# Patient Record
Sex: Male | Born: 1970
Health system: Southern US, Community
[De-identification: ages and names within clinical notes are randomized; demographics above are authoritative.]

## PROBLEM LIST (undated history)

## (undated) DIAGNOSIS — R7303 Prediabetes: Secondary | ICD-10-CM

## (undated) DIAGNOSIS — I10 Essential (primary) hypertension: Secondary | ICD-10-CM

## (undated) DIAGNOSIS — E785 Hyperlipidemia, unspecified: Secondary | ICD-10-CM

---

## 2018-06-03 DIAGNOSIS — Z0000 Encounter for general adult medical examination without abnormal findings: Secondary | ICD-10-CM | POA: Diagnosis not present

## 2018-06-03 DIAGNOSIS — I10 Essential (primary) hypertension: Secondary | ICD-10-CM | POA: Diagnosis not present

## 2018-06-03 DIAGNOSIS — Z8379 Family history of other diseases of the digestive system: Secondary | ICD-10-CM | POA: Diagnosis not present

## 2018-06-03 DIAGNOSIS — R69 Illness, unspecified: Secondary | ICD-10-CM | POA: Diagnosis not present

## 2018-06-03 DIAGNOSIS — E7800 Pure hypercholesterolemia, unspecified: Secondary | ICD-10-CM | POA: Diagnosis not present

## 2018-06-03 DIAGNOSIS — R7303 Prediabetes: Secondary | ICD-10-CM | POA: Diagnosis not present

## 2018-06-03 DIAGNOSIS — Z125 Encounter for screening for malignant neoplasm of prostate: Secondary | ICD-10-CM | POA: Diagnosis not present

## 2018-06-03 DIAGNOSIS — R194 Change in bowel habit: Secondary | ICD-10-CM | POA: Diagnosis not present

## 2018-07-21 DIAGNOSIS — Z713 Dietary counseling and surveillance: Secondary | ICD-10-CM | POA: Diagnosis not present

## 2018-07-21 DIAGNOSIS — Z6833 Body mass index (BMI) 33.0-33.9, adult: Secondary | ICD-10-CM | POA: Diagnosis not present

## 2018-07-28 DIAGNOSIS — Z79899 Other long term (current) drug therapy: Secondary | ICD-10-CM | POA: Diagnosis not present

## 2018-08-18 DIAGNOSIS — Z713 Dietary counseling and surveillance: Secondary | ICD-10-CM | POA: Diagnosis not present

## 2018-08-18 DIAGNOSIS — Z6833 Body mass index (BMI) 33.0-33.9, adult: Secondary | ICD-10-CM | POA: Diagnosis not present

## 2018-10-19 DIAGNOSIS — H524 Presbyopia: Secondary | ICD-10-CM | POA: Diagnosis not present

## 2018-10-19 DIAGNOSIS — H5213 Myopia, bilateral: Secondary | ICD-10-CM | POA: Diagnosis not present

## 2019-02-06 ENCOUNTER — Other Ambulatory Visit: Payer: Self-pay

## 2019-02-06 ENCOUNTER — Emergency Department (HOSPITAL_COMMUNITY)
Admission: EM | Admit: 2019-02-06 | Discharge: 2019-02-06 | Disposition: A | Discharge status: Home / Self Care | Payer: 59 | Attending: Emergency Medicine | Admitting: Emergency Medicine

## 2019-02-06 ENCOUNTER — Encounter (HOSPITAL_COMMUNITY): Payer: Self-pay | Admitting: Emergency Medicine

## 2019-02-06 ENCOUNTER — Emergency Department (HOSPITAL_COMMUNITY): Payer: 59

## 2019-02-06 DIAGNOSIS — Y999 Unspecified external cause status: Secondary | ICD-10-CM | POA: Insufficient documentation

## 2019-02-06 DIAGNOSIS — Y929 Unspecified place or not applicable: Secondary | ICD-10-CM | POA: Diagnosis not present

## 2019-02-06 DIAGNOSIS — Z23 Encounter for immunization: Secondary | ICD-10-CM | POA: Insufficient documentation

## 2019-02-06 DIAGNOSIS — S99921A Unspecified injury of right foot, initial encounter: Secondary | ICD-10-CM

## 2019-02-06 DIAGNOSIS — Y9389 Activity, other specified: Secondary | ICD-10-CM | POA: Diagnosis not present

## 2019-02-06 DIAGNOSIS — S91342A Puncture wound with foreign body, left foot, initial encounter: Secondary | ICD-10-CM | POA: Insufficient documentation

## 2019-02-06 DIAGNOSIS — W11XXXA Fall on and from ladder, initial encounter: Secondary | ICD-10-CM | POA: Insufficient documentation

## 2019-02-06 DIAGNOSIS — I10 Essential (primary) hypertension: Secondary | ICD-10-CM | POA: Insufficient documentation

## 2019-02-06 DIAGNOSIS — T148XXA Other injury of unspecified body region, initial encounter: Secondary | ICD-10-CM

## 2019-02-06 HISTORY — DX: Essential (primary) hypertension: I10

## 2019-02-06 HISTORY — DX: Hyperlipidemia, unspecified: E78.5

## 2019-02-06 HISTORY — DX: Prediabetes: R73.03

## 2019-02-06 MED ORDER — DOXYCYCLINE HYCLATE 100 MG PO CAPS: 100.0000 mg | ORAL_CAPSULE | Freq: Two times a day (BID) | ORAL | 0 refills | Status: DC

## 2019-02-06 MED ORDER — LIDOCAINE-EPINEPHRINE (PF) 2 %-1:200000 IJ SOLN
10.0000 mL | Freq: Once | INTRAMUSCULAR | Status: AC
  Administered 2019-02-06: 10 mL
  Filled 2019-02-06: qty 20

## 2019-02-06 MED ORDER — TETANUS-DIPHTH-ACELL PERTUSSIS 5-2.5-18.5 LF-MCG/0.5 IM SUSP
0.5000 mL | Freq: Once | INTRAMUSCULAR | Status: AC
  Administered 2019-02-06: 14:00:00 0.5 mL via INTRAMUSCULAR
  Filled 2019-02-06: qty 0.5

## 2019-02-06 NOTE — ED Provider Notes (Signed)
Blood pressure 116/80, pulse 65, temperature 98.2 F (36.8 C), temperature source Oral, resp. rate 12, height 5\' 8"  (1.727 m), weight 99.8 kg, SpO2 98 %.  Assuming care from Dr. Anitra Lauth.  In short, Jesse Jenkins is a 48 y.o. male with a chief complaint of Foot Injury .  Refer to the original H&P for additional details.  The current plan of care is to follow up on x-ray.  04:30 PM  Followed up on x-ray.  No fractures or obvious bony injury.  Wound was cleaned and will be dressed by staff.  Provided crutches and instructions to keep the wound clean and dry.  Provided a work note and follow-up with podiatry.  Patient started on doxycycline.  Discussed symptoms of wound infection and reasons to return to the emergency department. Patient comfortable with the plan at discharge.     Maia Plan, MD 02/06/19 [phone]

## 2019-02-06 NOTE — ED Triage Notes (Signed)
Patient brought in by wife, states he was cleaning his windows outside when he was on a ladder that collapsed on him causing him to fall down. Patient has part of a branch sticking out of the bottom of his right foot. No other injuries noted.

## 2019-02-06 NOTE — Progress Notes (Signed)
Orthopedic Tech Progress Note Patient Details:  Jesse Jenkins Sep 12, 1970 [id number]  Ortho Devices Type of Ortho Device: Crutches Ortho Device/Splint Interventions: Application   Post Interventions Patient Tolerated: Well Instructions Provided: Care of device   Saul Fordyce 02/06/2019, 4:58 PM

## 2019-02-06 NOTE — Discharge Instructions (Signed)
You were seen in the emergency department today with a puncture wound over the foot.  We are leaving the wound open to prevent infection.  I am starting you on an antibiotic.  Please call the podiatrist listed tomorrow to schedule a follow-up appointment in the coming week.  Use the crutches to help keep weight off the foot.  Take Tylenol and or Motrin for pain. Keep the area as clean and dry as possible.

## 2019-02-06 NOTE — ED Provider Notes (Signed)
[facility] EMERGENCY DEPARTMENT Provider Note   CSN: [id number] Arrival date & time: 02/06/19  1337    History   Chief Complaint Chief Complaint  Patient presents with  . Foot Injury    HPI Jesse Jenkins is a 48 y.o. male.     The history is provided by the patient.  Foot Injury  Location:  Foot Time since incident:  1 hour Injury: yes   Mechanism of injury comment:  Patient was on a ladder that broke causing him to go down multiple rungs and then landed on a tree branch Foot location:  Sole of L foot Pain details:    Quality:  Shooting, throbbing and sharp   Radiates to:  Does not radiate   Severity:  Severe   Onset quality:  Sudden   Timing:  Constant   Progression:  Unchanged Chronicity:  New Foreign body present:  Wood Tetanus status:  Out of date Prior injury to area:  No Relieved by:  None tried Worsened by:  Activity and bearing weight Ineffective treatments:  None tried Associated symptoms: no decreased ROM, no numbness, no swelling and no tingling   Risk factors comment:  History of hyperlipidemia, hypertension but does not take anticoagulation.   Past Medical History:  Diagnosis Date  . Borderline diabetes   . Hyperlipidemia   . Hypertension     There are no active problems to display for this patient.   History reviewed. No pertinent surgical history.      Home Medications    Prior to Admission medications   Not on File    Family History No family history on file.  Social History Social History   Tobacco Use  . Smoking status: Never Smoker  . Smokeless tobacco: Never Used  Substance Use Topics  . Alcohol use: Yes    Comment: 1 drink a week  . Drug use: Never     Allergies   Patient has no known allergies.   Review of Systems Review of Systems  All other systems reviewed and are negative.    Physical Exam Updated Vital Signs BP 116/80   Pulse 65   Temp 98.2 F (36.8 C) (Oral)   Resp 12    Ht 5\' 8"  (1.727 m)   Wt 99.8 kg   SpO2 98%   BMI 33.45 kg/m   Physical Exam Vitals signs and nursing note reviewed.  Constitutional:      Appearance: Normal appearance.  HENT:     Head: Normocephalic.  Cardiovascular:     Rate and Rhythm: Normal rate.  Pulmonary:     Effort: Pulmonary effort is normal.  Musculoskeletal:     Comments: Large .5cm radius tree branch sticking out of the bottom of his right foot.    Skin:    Capillary Refill: Capillary refill takes less than 2 seconds.  Neurological:     General: No focal deficit present.     Mental Status: He is alert and oriented to person, place, and time. Mental status is at baseline.  Psychiatric:        Mood and Affect: Mood normal.      ED Treatments / Results  Labs (all labs ordered are listed, but only abnormal results are displayed) Labs Reviewed - No data to display  EKG None  Radiology No results found.  Procedures .Foreign Body Removal Date/Time: 02/06/2019 3:18 PM Performed by: Gwyneth Sprout, MD Authorized by: Gwyneth Sprout, MD  Consent: Verbal consent obtained. Risks and  benefits: risks, benefits and alternatives were discussed Consent given by: patient Patient understanding: patient states understanding of the procedure being performed Patient identity confirmed: verbally with patient Body area: skin General location: lower extremity Location details: right foot Anesthesia: local infiltration  Anesthesia: Local Anesthetic: lidocaine 2% with epinephrine Anesthetic total: 10 mL  Sedation: Patient sedated: no  Patient restrained: no Localization method: visualized Dressing: dressing applied Tendon involvement: none Depth: deep Complexity: simple 1 objects recovered. Objects recovered: tree branch Post-procedure assessment: foreign body removed Patient tolerance: Patient tolerated the procedure well with no immediate complications   (including critical care time)  Medications  Ordered in ED Medications  lidocaine-EPINEPHrine (XYLOCAINE W/EPI) 2 %-1:200000 (PF) injection 10 mL (10 mLs Infiltration Given 02/06/19 1403)  Tdap (BOOSTRIX) injection 0.5 mL (0.5 mLs Intramuscular Given 02/06/19 1403)     Initial Impression / Assessment and Plan / ED Course  I have reviewed the triage vital signs and the nursing notes.  Pertinent labs & imaging results that were available during my care of the patient were reviewed by me and considered in my medical decision making (see chart for details).        Patient presenting today due to a foreign body coming out of his foot.  He landed on his large thick stick which is currently impaled in his foot.  He is neurovascularly intact at this time.  He takes no anticoagulation.  Foreign body was removed as above and now a puncture present on the bottom of his foot and he is bleeding.  Wound was irrigated with normal saline.  X-rays pending.  Patient checked out to Dr. Jacqulyn Bathlong.  Final Clinical Impressions(s) / ED Diagnoses   Final diagnoses:  None    ED Discharge Orders    None       Gwyneth SproutPlunkett, Marcel Gary, MD 02/06/19 1520

## 2019-02-07 ENCOUNTER — Encounter: Payer: Self-pay | Admitting: Podiatry

## 2019-02-07 ENCOUNTER — Ambulatory Visit: Payer: 59 | Admitting: Podiatry

## 2019-02-07 VITALS — Temp 97.2°F

## 2019-02-07 DIAGNOSIS — S91331A Puncture wound without foreign body, right foot, initial encounter: Secondary | ICD-10-CM | POA: Diagnosis not present

## 2019-02-07 MED ORDER — GENTAMICIN SULFATE 0.1 % EX CREA: 1.0000 "application " | TOPICAL_CREAM | Freq: Two times a day (BID) | CUTANEOUS | 1 refills | Status: AC

## 2019-02-07 MED ORDER — DOXYCYCLINE HYCLATE 100 MG PO CAPS: 100.0000 mg | ORAL_CAPSULE | Freq: Two times a day (BID) | ORAL | 0 refills | Status: DC

## 2019-02-08 ENCOUNTER — Telehealth: Payer: Self-pay | Admitting: *Deleted

## 2019-02-08 MED ORDER — DOXYCYCLINE HYCLATE 100 MG PO CAPS: 100.0000 mg | ORAL_CAPSULE | Freq: Two times a day (BID) | ORAL | 0 refills | Status: AC

## 2019-02-08 MED ORDER — DOXYCYCLINE HYCLATE 100 MG PO CAPS: 100.0000 mg | ORAL_CAPSULE | Freq: Two times a day (BID) | ORAL | 0 refills | Status: DC

## 2019-02-08 NOTE — Telephone Encounter (Signed)
I called pt and explained the doxycycline was ordered but occasionally in our computer system, if there was a pharmacy change then after one order there second order would go back to the original pharmacy. I told pt I would send to the [facility]. I told pt he could shower, but no standing water, like bathtub, or pool, rinse the area well and pat dry cover with the getamicin cream dressing. Pt states understanding.

## 2019-02-08 NOTE — Telephone Encounter (Signed)
Pt states he had some questions about the paperwork after his visit, he did get the gentamicin cream, but the doxycycline was not at the pharmacy and he did not get a hard copy of the prescription, and he wanted to know if he could shower.

## 2019-02-14 ENCOUNTER — Other Ambulatory Visit: Payer: Self-pay

## 2019-02-14 ENCOUNTER — Encounter: Payer: Self-pay | Admitting: Podiatry

## 2019-02-14 ENCOUNTER — Ambulatory Visit (INDEPENDENT_AMBULATORY_CARE_PROVIDER_SITE_OTHER): Payer: 59 | Admitting: Podiatry

## 2019-02-14 VITALS — Temp 97.3°F

## 2019-02-14 DIAGNOSIS — S91339D Puncture wound without foreign body, unspecified foot, subsequent encounter: Secondary | ICD-10-CM | POA: Diagnosis not present

## 2019-02-14 NOTE — Progress Notes (Signed)
   HPI: 48 year old otherwise healthy male presents the office today for evaluation of a right foot injury.  He impaled his right foot on a tree branch on 631 2020.  He presented to the emergency department where the stick was removed from the foot.  X-rays were taken and oral doxycycline was prescribed.  He was instructed to follow-up here in the office.  Past Medical History:  Diagnosis Date  . Borderline diabetes   . Hyperlipidemia   . Hypertension      Physical Exam: General: The patient is alert and oriented x3 in no acute distress.  Dermatology: Skin is warm, dry and supple bilateral lower extremities.  Puncture wound noted to the right foot measuring approximately 1.5 x 1.0 x 0.1 cm.  The wound appears to be failed and and is no longer deep.  No sign of infectious process noted.  No malodor noted.  Minimal drainage noted.  Vascular: Palpable pedal pulses bilaterally. No edema or erythema noted. Capillary refill within normal limits.  Neurological: Epicritic and protective threshold grossly intact bilaterally.   Musculoskeletal Exam: Range of motion within normal limits to all pedal and ankle joints bilateral. Muscle strength 5/5 in all groups bilateral.  There is some tenderness to palpation along the area of the puncture wound.  Assessment: 1.  Puncture wound right midfoot secondary to tree branch   Plan of Care:  1. Patient evaluated.   2.  It appears as though the wound is healing routinely.  Patient was only given a short course of oral doxycycline.  Today a refill for doxycycline 100 mg was provided 3.  Prescription for gentamicin cream to be applied with light dressing daily 4.  The foot is very tender to palpation and affecting his ability to ambulate.  Today a cam boot was dispensed 5.  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 Edrick Kins, DPM    [address], [place] [address]                Office [phone]  Fax [phone]

## 2019-02-14 NOTE — Patient Instructions (Signed)
-  Continue cam boot x1 week, after 1 week transition into good sneakers -Finish oral antibiotic doxycycline -Continue gentamicin cream 2 times daily -Estimated return to work with full activity no restrictions will be approximately 3-4 weeks from today

## 2019-02-17 NOTE — Progress Notes (Signed)
   HPI: 48 year old otherwise healthy male presents the office today for follow up evaluation of a right foot injury. He reports that his foot feels better this week. He has been taking Tramadol for pain and is still taking Doxycycline. There are no worsening factors noted. Patient is here for further evaluation and treatment.   Past Medical History:  Diagnosis Date  . Borderline diabetes   . Hyperlipidemia   . Hypertension      Physical Exam: General: The patient is alert and oriented x3 in no acute distress.  Dermatology: Skin is warm, dry and supple bilateral lower extremities.  Puncture wound noted to the right foot measuring approximately 0.8 x 1.5 x 0.1 cm.  The wound appears to be failed and and is no longer deep.  No sign of infectious process noted.  No malodor noted.  Minimal drainage noted.  Vascular: Palpable pedal pulses bilaterally. No edema or erythema noted. Capillary refill within normal limits.  Neurological: Epicritic and protective threshold grossly intact bilaterally.   Musculoskeletal Exam: Range of motion within normal limits to all pedal and ankle joints bilateral. Muscle strength 5/5 in all groups bilateral.  There is some tenderness to palpation along the area of the puncture wound.  Assessment: 1. Puncture wound right midfoot secondary to tree branch with routine healing    Plan of Care:  1. Patient evaluated.   2. Continue taking oral Doxycycline until finished.  3. Continue using Gentamicin cream.  4. Continue using CAM boot for one week, then transition into good sneakers.  5. Return to clinic in 2 weeks.   *Physical therapist for advanced home care      Edrick Kins, DPM [facility]  Dr. Edrick Kins, DPM    [address], [place] [address]                Office [phone]  Fax [phone]

## 2019-02-28 ENCOUNTER — Encounter: Payer: Self-pay | Admitting: Podiatry

## 2019-02-28 ENCOUNTER — Ambulatory Visit: Payer: 59 | Admitting: Podiatry

## 2019-02-28 ENCOUNTER — Other Ambulatory Visit: Payer: Self-pay

## 2019-02-28 VITALS — Temp 97.0°F

## 2019-02-28 DIAGNOSIS — S91339D Puncture wound without foreign body, unspecified foot, subsequent encounter: Secondary | ICD-10-CM

## 2019-03-02 NOTE — Progress Notes (Signed)
   HPI: 48 year old otherwise healthy male presents the office today for follow up evaluation of a wound noted to the right foot. He states he is doing very will with minimal pain that is only present when he is on the foot. He reports associated mild drainage. He has finished taking the Doxycycline as prescribed. Patient is here for further evaluation and treatment.   Past Medical History:  Diagnosis Date  . Borderline diabetes   . Hyperlipidemia   . Hypertension      Physical Exam: General: The patient is alert and oriented x3 in no acute distress.  Dermatology: Skin is warm, dry and supple bilateral lower extremities.  Puncture wound noted to the right foot measuring approximately 0.4 x 0.1 x 0.2 cm.  The wound appears to be failed and and is no longer deep.  No sign of infectious process noted.  No malodor noted.  Minimal drainage noted.  Vascular: Palpable pedal pulses bilaterally. No edema or erythema noted. Capillary refill within normal limits.  Neurological: Epicritic and protective threshold grossly intact bilaterally.   Musculoskeletal Exam: Range of motion within normal limits to all pedal and ankle joints bilateral. Muscle strength 5/5 in all groups bilateral.  There is some tenderness to palpation along the area of the puncture wound.  Assessment: 1. Puncture wound right midfoot secondary to tree branch with routine healing    Plan of Care:  1. Patient evaluated.   2. Continue wearing good sneakers for the next two weeks.   3. Continue using Gentamicin cream daily.  4. Return to work full activity with no restrictions in two weeks. 5. FMLA paperwork to be completed by our office today.  6. Return to clinic as needed.   *Physical therapist for advanced home care      Edrick Kins, DPM [facility]  Dr. Edrick Kins, DPM    [address], [place] [address]                Office [phone]  Fax  [phone]

## 2020-01-10 IMAGING — CR RIGHT FOOT COMPLETE - 3+ VIEW
3 series · 3 of 3 positions shown · non-contrast
Comparison: None.

CLINICAL DATA: Patient brought in by wife, states he was cleaning
his windows outside when he was on a ladder that collapsed on him
causing him to fall down. Patient had part of a branch sticking out
of the bottom of his right foot. Branch has been removed.foreign
body that now removed and puncture wound

EXAM:
RIGHT FOOT COMPLETE - 3+ VIEW

[foot ap]
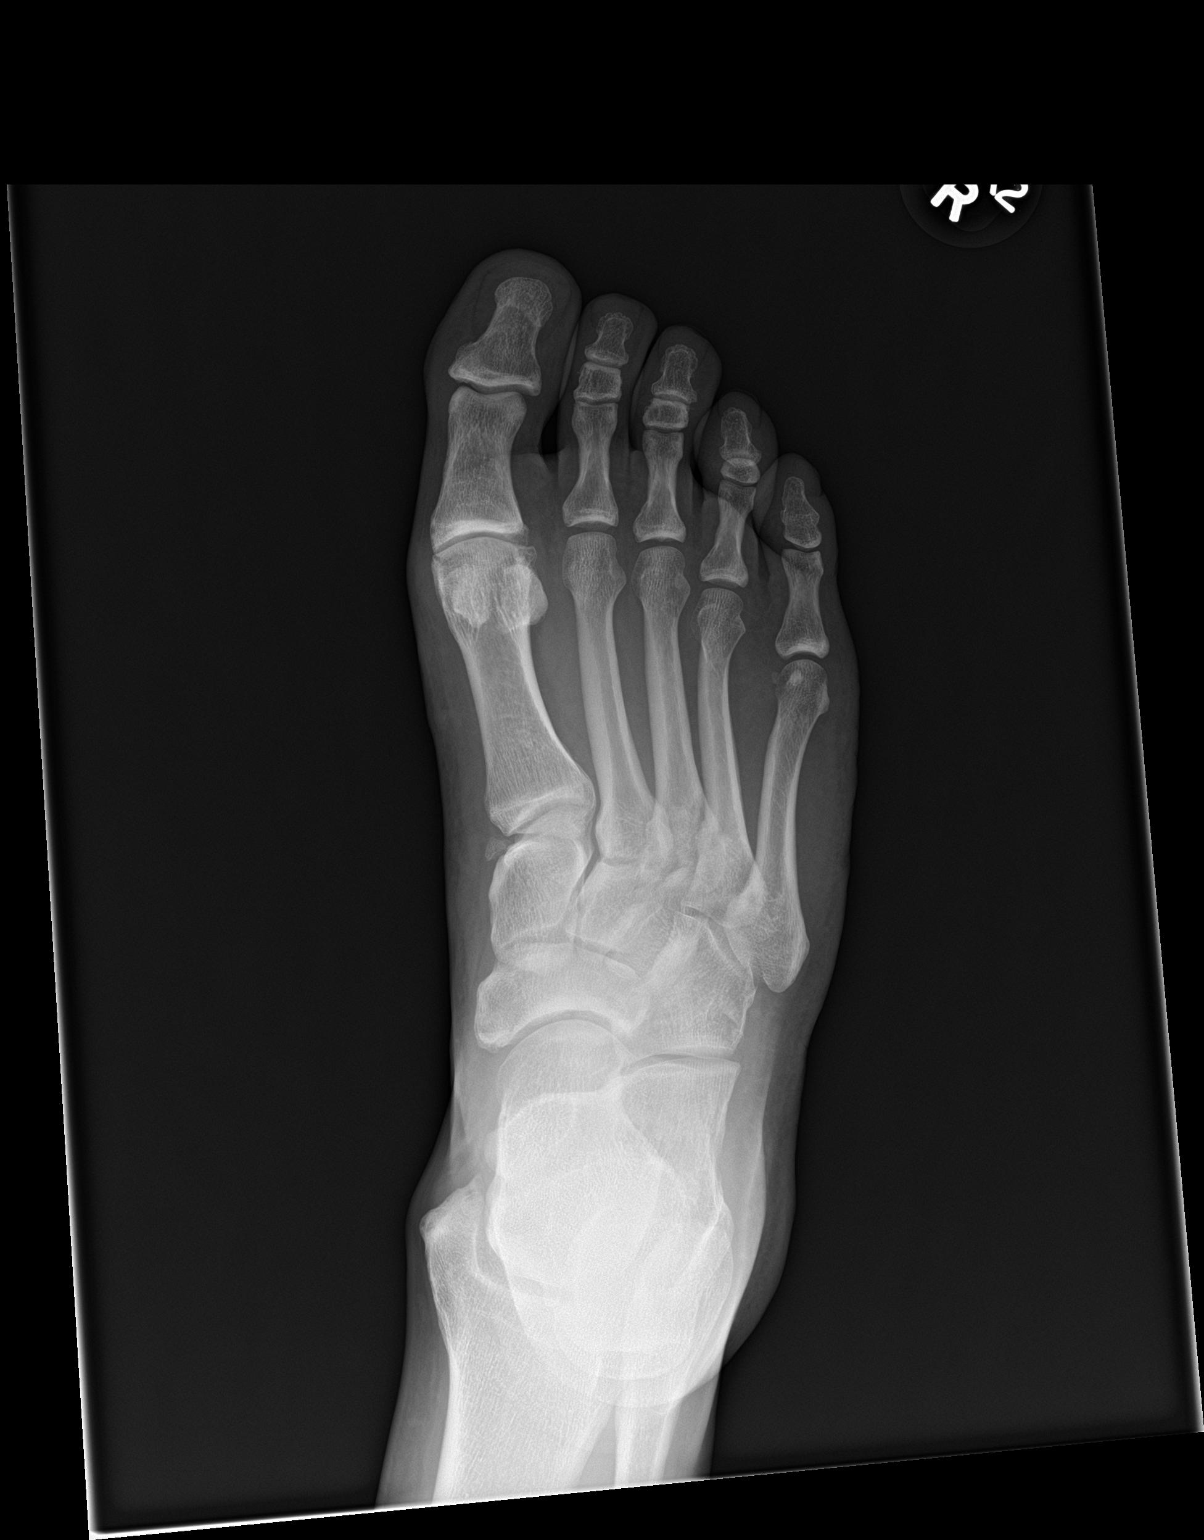

[foot obl]
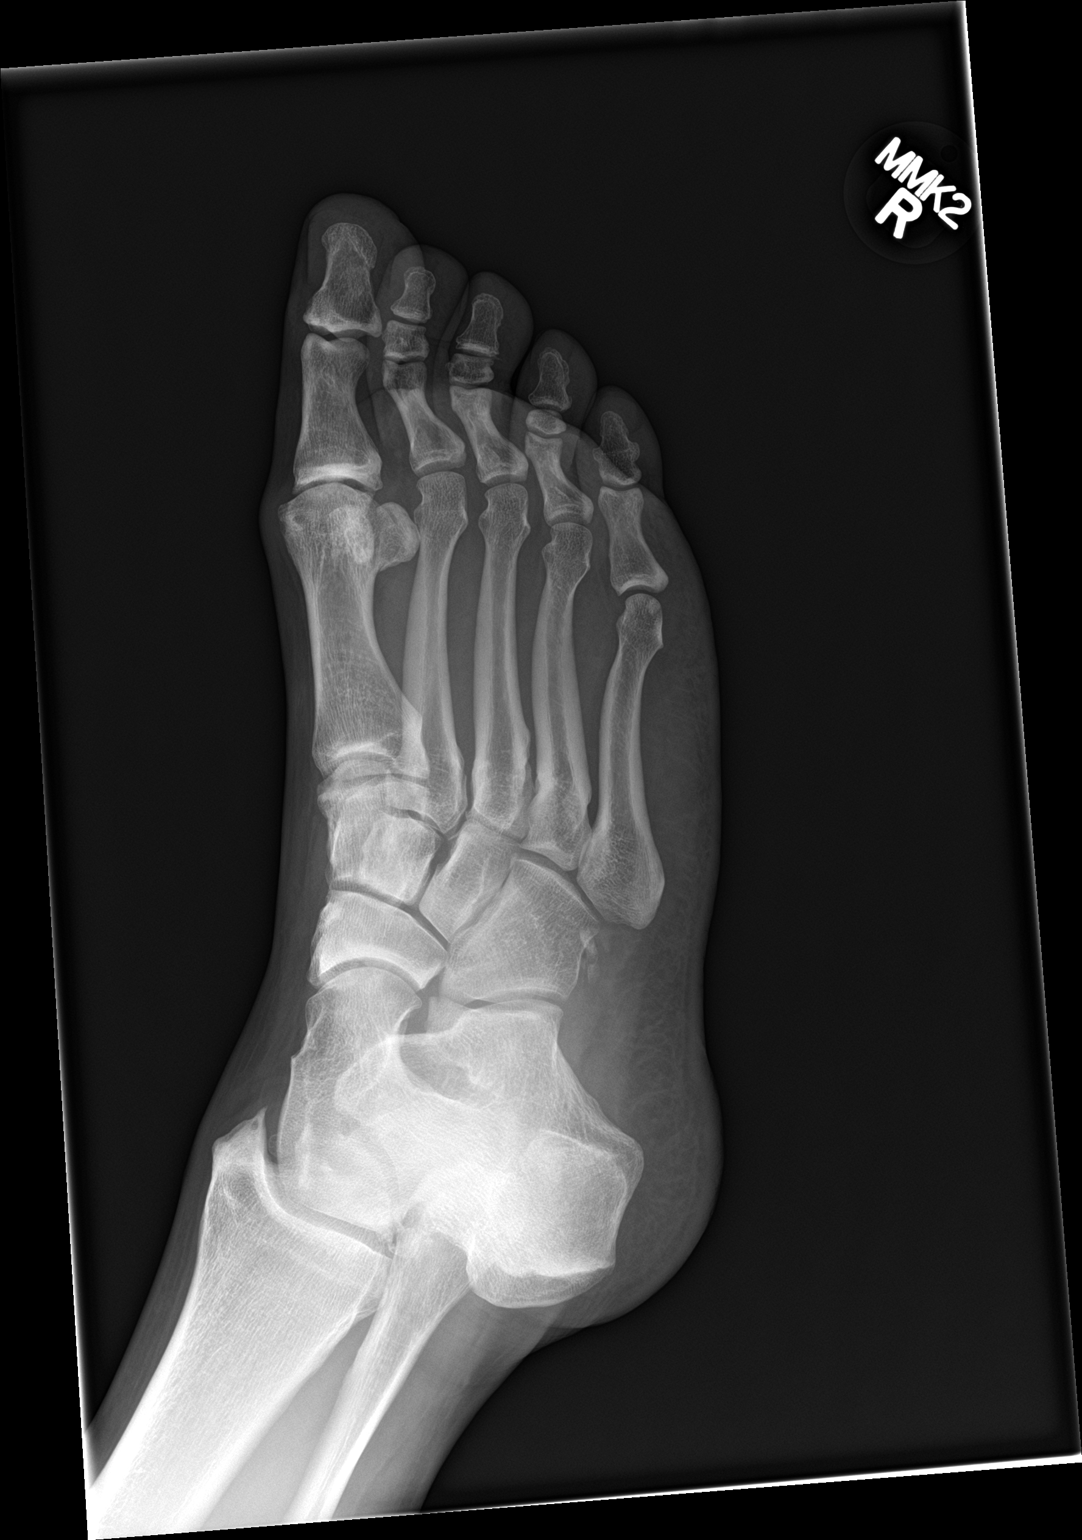

[foot lat]
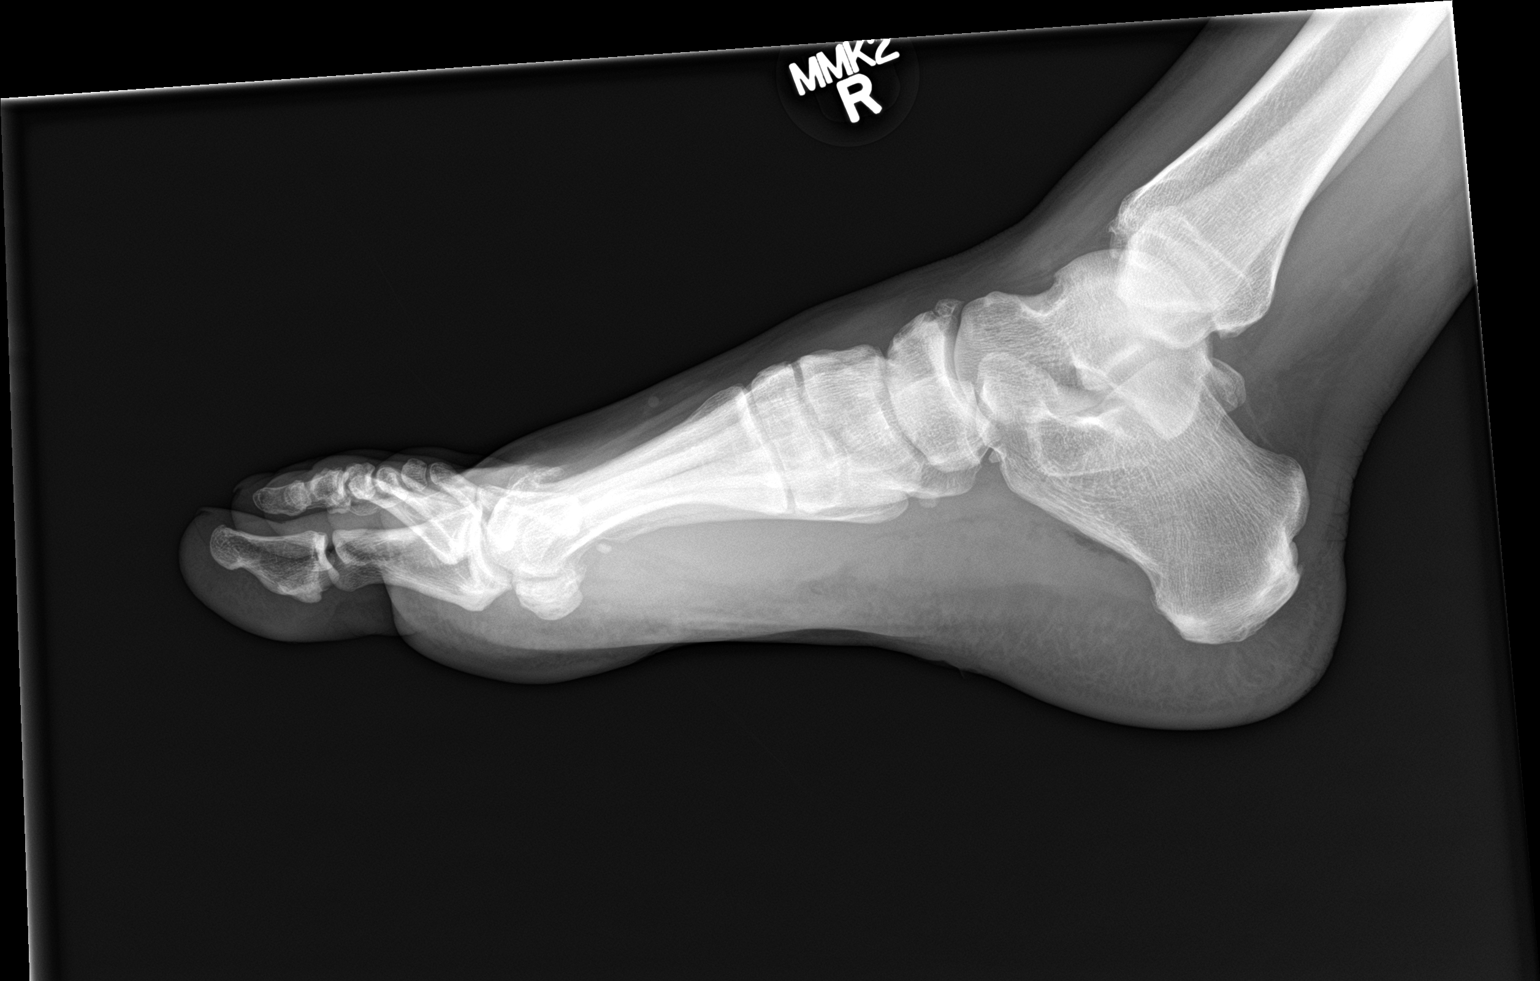

[3 of 3 positions shown; findings below may reference images not displayed]

FINDINGS: No fracture or dislocation of mid foot or forefoot. The phalanges
are normal. The calcaneus is normal. No soft tissue abnormality. No
foreign body
IMPRESSION: No fracture or foreign body.

## 2020-02-09 DIAGNOSIS — R0681 Apnea, not elsewhere classified: Secondary | ICD-10-CM | POA: Diagnosis not present

## 2020-02-29 DIAGNOSIS — G4733 Obstructive sleep apnea (adult) (pediatric): Secondary | ICD-10-CM | POA: Diagnosis not present

## 2020-03-02 DIAGNOSIS — I10 Essential (primary) hypertension: Secondary | ICD-10-CM | POA: Diagnosis not present

## 2020-03-02 DIAGNOSIS — G4733 Obstructive sleep apnea (adult) (pediatric): Secondary | ICD-10-CM | POA: Diagnosis not present

## 2020-04-01 DIAGNOSIS — G4733 Obstructive sleep apnea (adult) (pediatric): Secondary | ICD-10-CM | POA: Diagnosis not present

## 2020-04-01 DIAGNOSIS — I10 Essential (primary) hypertension: Secondary | ICD-10-CM | POA: Diagnosis not present

## 2020-05-02 DIAGNOSIS — G4733 Obstructive sleep apnea (adult) (pediatric): Secondary | ICD-10-CM | POA: Diagnosis not present

## 2020-05-02 DIAGNOSIS — I10 Essential (primary) hypertension: Secondary | ICD-10-CM | POA: Diagnosis not present

## 2020-05-23 DIAGNOSIS — G4733 Obstructive sleep apnea (adult) (pediatric): Secondary | ICD-10-CM | POA: Diagnosis not present

## 2020-06-02 DIAGNOSIS — G4733 Obstructive sleep apnea (adult) (pediatric): Secondary | ICD-10-CM | POA: Diagnosis not present

## 2020-06-02 DIAGNOSIS — I10 Essential (primary) hypertension: Secondary | ICD-10-CM | POA: Diagnosis not present

## 2020-07-02 DIAGNOSIS — I10 Essential (primary) hypertension: Secondary | ICD-10-CM | POA: Diagnosis not present

## 2020-07-02 DIAGNOSIS — Z0000 Encounter for general adult medical examination without abnormal findings: Secondary | ICD-10-CM | POA: Diagnosis not present

## 2020-07-02 DIAGNOSIS — E559 Vitamin D deficiency, unspecified: Secondary | ICD-10-CM | POA: Diagnosis not present

## 2020-07-02 DIAGNOSIS — E7800 Pure hypercholesterolemia, unspecified: Secondary | ICD-10-CM | POA: Diagnosis not present

## 2020-07-02 DIAGNOSIS — G4733 Obstructive sleep apnea (adult) (pediatric): Secondary | ICD-10-CM | POA: Diagnosis not present

## 2020-07-02 DIAGNOSIS — R69 Illness, unspecified: Secondary | ICD-10-CM | POA: Diagnosis not present

## 2020-07-02 DIAGNOSIS — R7303 Prediabetes: Secondary | ICD-10-CM | POA: Diagnosis not present

## 2020-08-02 DIAGNOSIS — G4733 Obstructive sleep apnea (adult) (pediatric): Secondary | ICD-10-CM | POA: Diagnosis not present

## 2020-08-02 DIAGNOSIS — I10 Essential (primary) hypertension: Secondary | ICD-10-CM | POA: Diagnosis not present

## 2020-08-18 DIAGNOSIS — Z03818 Encounter for observation for suspected exposure to other biological agents ruled out: Secondary | ICD-10-CM | POA: Diagnosis not present

## 2020-08-18 DIAGNOSIS — Z20822 Contact with and (suspected) exposure to covid-19: Secondary | ICD-10-CM | POA: Diagnosis not present

## 2020-08-22 DIAGNOSIS — Z800 Family history of malignant neoplasm of digestive organs: Secondary | ICD-10-CM | POA: Diagnosis not present

## 2020-08-22 DIAGNOSIS — Z1211 Encounter for screening for malignant neoplasm of colon: Secondary | ICD-10-CM | POA: Diagnosis not present

## 2020-09-01 DIAGNOSIS — I10 Essential (primary) hypertension: Secondary | ICD-10-CM | POA: Diagnosis not present

## 2020-09-01 DIAGNOSIS — G4733 Obstructive sleep apnea (adult) (pediatric): Secondary | ICD-10-CM | POA: Diagnosis not present

## 2020-10-02 DIAGNOSIS — I10 Essential (primary) hypertension: Secondary | ICD-10-CM | POA: Diagnosis not present

## 2020-10-02 DIAGNOSIS — G4733 Obstructive sleep apnea (adult) (pediatric): Secondary | ICD-10-CM | POA: Diagnosis not present

## 2020-11-02 DIAGNOSIS — I10 Essential (primary) hypertension: Secondary | ICD-10-CM | POA: Diagnosis not present

## 2020-11-02 DIAGNOSIS — G4733 Obstructive sleep apnea (adult) (pediatric): Secondary | ICD-10-CM | POA: Diagnosis not present

## 2020-11-07 DIAGNOSIS — Z01812 Encounter for preprocedural laboratory examination: Secondary | ICD-10-CM | POA: Diagnosis not present

## 2020-11-12 DIAGNOSIS — Z1211 Encounter for screening for malignant neoplasm of colon: Secondary | ICD-10-CM | POA: Diagnosis not present

## 2020-11-21 DIAGNOSIS — I10 Essential (primary) hypertension: Secondary | ICD-10-CM | POA: Diagnosis not present

## 2020-11-21 DIAGNOSIS — G4733 Obstructive sleep apnea (adult) (pediatric): Secondary | ICD-10-CM | POA: Diagnosis not present

## 2020-11-28 DIAGNOSIS — R269 Unspecified abnormalities of gait and mobility: Secondary | ICD-10-CM | POA: Diagnosis not present

## 2020-11-28 DIAGNOSIS — M25561 Pain in right knee: Secondary | ICD-10-CM | POA: Diagnosis not present

## 2020-11-30 DIAGNOSIS — G4733 Obstructive sleep apnea (adult) (pediatric): Secondary | ICD-10-CM | POA: Diagnosis not present

## 2020-11-30 DIAGNOSIS — I10 Essential (primary) hypertension: Secondary | ICD-10-CM | POA: Diagnosis not present

## 2020-12-20 DIAGNOSIS — S83281A Other tear of lateral meniscus, current injury, right knee, initial encounter: Secondary | ICD-10-CM | POA: Diagnosis not present

## 2020-12-31 DIAGNOSIS — G4733 Obstructive sleep apnea (adult) (pediatric): Secondary | ICD-10-CM | POA: Diagnosis not present

## 2020-12-31 DIAGNOSIS — I10 Essential (primary) hypertension: Secondary | ICD-10-CM | POA: Diagnosis not present

## 2020-12-31 DIAGNOSIS — R7303 Prediabetes: Secondary | ICD-10-CM | POA: Diagnosis not present

## 2021-01-01 DIAGNOSIS — M25561 Pain in right knee: Secondary | ICD-10-CM | POA: Diagnosis not present

## 2021-01-03 DIAGNOSIS — S83241A Other tear of medial meniscus, current injury, right knee, initial encounter: Secondary | ICD-10-CM | POA: Diagnosis not present

## 2021-01-11 DIAGNOSIS — M94261 Chondromalacia, right knee: Secondary | ICD-10-CM | POA: Diagnosis not present

## 2021-01-11 DIAGNOSIS — Y999 Unspecified external cause status: Secondary | ICD-10-CM | POA: Diagnosis not present

## 2021-01-11 DIAGNOSIS — S83281A Other tear of lateral meniscus, current injury, right knee, initial encounter: Secondary | ICD-10-CM | POA: Diagnosis not present

## 2021-01-11 DIAGNOSIS — X58XXXA Exposure to other specified factors, initial encounter: Secondary | ICD-10-CM | POA: Diagnosis not present

## 2021-01-11 DIAGNOSIS — G8918 Other acute postprocedural pain: Secondary | ICD-10-CM | POA: Diagnosis not present

## 2021-01-11 DIAGNOSIS — S83241A Other tear of medial meniscus, current injury, right knee, initial encounter: Secondary | ICD-10-CM | POA: Diagnosis not present

## 2021-01-17 ENCOUNTER — Other Ambulatory Visit (HOSPITAL_COMMUNITY): Payer: Self-pay | Admitting: Orthopedic Surgery

## 2021-01-17 ENCOUNTER — Ambulatory Visit (HOSPITAL_COMMUNITY)
Admission: RE | Admit: 2021-01-17 | Discharge: 2021-01-17 | Disposition: A | Discharge status: Home / Self Care | Payer: 59 | Source: Ambulatory Visit | Attending: Orthopedic Surgery | Admitting: Orthopedic Surgery

## 2021-01-17 ENCOUNTER — Other Ambulatory Visit: Payer: Self-pay

## 2021-01-17 DIAGNOSIS — S83281D Other tear of lateral meniscus, current injury, right knee, subsequent encounter: Secondary | ICD-10-CM | POA: Diagnosis not present

## 2021-01-17 DIAGNOSIS — S83241D Other tear of medial meniscus, current injury, right knee, subsequent encounter: Secondary | ICD-10-CM | POA: Diagnosis not present

## 2021-01-17 DIAGNOSIS — M7989 Other specified soft tissue disorders: Secondary | ICD-10-CM | POA: Insufficient documentation

## 2021-01-17 DIAGNOSIS — M79604 Pain in right leg: Secondary | ICD-10-CM | POA: Diagnosis not present

## 2021-01-17 NOTE — Progress Notes (Signed)
Lower extremity venous has been completed.   Preliminary results in [facility].   Blanch Media 01/17/2021 3:16 PM

## 2021-01-18 DIAGNOSIS — R269 Unspecified abnormalities of gait and mobility: Secondary | ICD-10-CM | POA: Diagnosis not present

## 2021-01-18 DIAGNOSIS — M25561 Pain in right knee: Secondary | ICD-10-CM | POA: Diagnosis not present

## 2021-01-22 DIAGNOSIS — S83281D Other tear of lateral meniscus, current injury, right knee, subsequent encounter: Secondary | ICD-10-CM | POA: Diagnosis not present

## 2021-01-22 DIAGNOSIS — S83241D Other tear of medial meniscus, current injury, right knee, subsequent encounter: Secondary | ICD-10-CM | POA: Diagnosis not present

## 2021-02-07 DIAGNOSIS — S83241D Other tear of medial meniscus, current injury, right knee, subsequent encounter: Secondary | ICD-10-CM | POA: Diagnosis not present

## 2021-02-07 DIAGNOSIS — S83281D Other tear of lateral meniscus, current injury, right knee, subsequent encounter: Secondary | ICD-10-CM | POA: Diagnosis not present

## 2021-05-29 DIAGNOSIS — G4733 Obstructive sleep apnea (adult) (pediatric): Secondary | ICD-10-CM | POA: Diagnosis not present

## 2021-05-29 DIAGNOSIS — I10 Essential (primary) hypertension: Secondary | ICD-10-CM | POA: Diagnosis not present

## 2021-07-02 DIAGNOSIS — I10 Essential (primary) hypertension: Secondary | ICD-10-CM | POA: Diagnosis not present

## 2021-07-02 DIAGNOSIS — G4733 Obstructive sleep apnea (adult) (pediatric): Secondary | ICD-10-CM | POA: Diagnosis not present

## 2021-07-22 DIAGNOSIS — E7800 Pure hypercholesterolemia, unspecified: Secondary | ICD-10-CM | POA: Diagnosis not present

## 2021-07-22 DIAGNOSIS — I10 Essential (primary) hypertension: Secondary | ICD-10-CM | POA: Diagnosis not present

## 2021-07-22 DIAGNOSIS — E669 Obesity, unspecified: Secondary | ICD-10-CM | POA: Diagnosis not present

## 2021-07-22 DIAGNOSIS — E559 Vitamin D deficiency, unspecified: Secondary | ICD-10-CM | POA: Diagnosis not present

## 2021-07-22 DIAGNOSIS — R69 Illness, unspecified: Secondary | ICD-10-CM | POA: Diagnosis not present

## 2021-07-22 DIAGNOSIS — Z0000 Encounter for general adult medical examination without abnormal findings: Secondary | ICD-10-CM | POA: Diagnosis not present

## 2021-07-22 DIAGNOSIS — R7309 Other abnormal glucose: Secondary | ICD-10-CM | POA: Diagnosis not present

## 2021-07-22 DIAGNOSIS — Z125 Encounter for screening for malignant neoplasm of prostate: Secondary | ICD-10-CM | POA: Diagnosis not present

## 2021-10-08 DIAGNOSIS — G4733 Obstructive sleep apnea (adult) (pediatric): Secondary | ICD-10-CM | POA: Diagnosis not present

## 2021-10-08 DIAGNOSIS — I10 Essential (primary) hypertension: Secondary | ICD-10-CM | POA: Diagnosis not present

## 2022-03-25 DIAGNOSIS — E7800 Pure hypercholesterolemia, unspecified: Secondary | ICD-10-CM | POA: Diagnosis not present

## 2022-03-25 DIAGNOSIS — I10 Essential (primary) hypertension: Secondary | ICD-10-CM | POA: Diagnosis not present

## 2022-03-25 DIAGNOSIS — G4733 Obstructive sleep apnea (adult) (pediatric): Secondary | ICD-10-CM | POA: Diagnosis not present

## 2022-03-25 DIAGNOSIS — R5383 Other fatigue: Secondary | ICD-10-CM | POA: Diagnosis not present

## 2022-03-25 DIAGNOSIS — R7303 Prediabetes: Secondary | ICD-10-CM | POA: Diagnosis not present

## 2022-03-25 DIAGNOSIS — E669 Obesity, unspecified: Secondary | ICD-10-CM | POA: Diagnosis not present

## 2022-03-25 DIAGNOSIS — Z6837 Body mass index (BMI) 37.0-37.9, adult: Secondary | ICD-10-CM | POA: Diagnosis not present

## 2022-03-25 DIAGNOSIS — R0602 Shortness of breath: Secondary | ICD-10-CM | POA: Diagnosis not present

## 2022-03-25 DIAGNOSIS — Z1389 Encounter for screening for other disorder: Secondary | ICD-10-CM | POA: Diagnosis not present

## 2022-04-03 DIAGNOSIS — M25511 Pain in right shoulder: Secondary | ICD-10-CM | POA: Diagnosis not present

## 2022-05-06 DIAGNOSIS — E669 Obesity, unspecified: Secondary | ICD-10-CM | POA: Diagnosis not present

## 2022-05-06 DIAGNOSIS — E7800 Pure hypercholesterolemia, unspecified: Secondary | ICD-10-CM | POA: Diagnosis not present

## 2022-05-06 DIAGNOSIS — I10 Essential (primary) hypertension: Secondary | ICD-10-CM | POA: Diagnosis not present

## 2022-05-06 DIAGNOSIS — R7303 Prediabetes: Secondary | ICD-10-CM | POA: Diagnosis not present

## 2022-05-06 DIAGNOSIS — Z6835 Body mass index (BMI) 35.0-35.9, adult: Secondary | ICD-10-CM | POA: Diagnosis not present

## 2022-06-04 DIAGNOSIS — E669 Obesity, unspecified: Secondary | ICD-10-CM | POA: Diagnosis not present

## 2022-06-04 DIAGNOSIS — G4733 Obstructive sleep apnea (adult) (pediatric): Secondary | ICD-10-CM | POA: Diagnosis not present

## 2022-06-04 DIAGNOSIS — E7800 Pure hypercholesterolemia, unspecified: Secondary | ICD-10-CM | POA: Diagnosis not present

## 2022-06-04 DIAGNOSIS — Z6835 Body mass index (BMI) 35.0-35.9, adult: Secondary | ICD-10-CM | POA: Diagnosis not present

## 2022-06-04 DIAGNOSIS — I10 Essential (primary) hypertension: Secondary | ICD-10-CM | POA: Diagnosis not present

## 2022-06-04 DIAGNOSIS — R7303 Prediabetes: Secondary | ICD-10-CM | POA: Diagnosis not present

## 2022-06-10 DIAGNOSIS — H524 Presbyopia: Secondary | ICD-10-CM | POA: Diagnosis not present

## 2022-06-10 DIAGNOSIS — H5213 Myopia, bilateral: Secondary | ICD-10-CM | POA: Diagnosis not present

## 2022-06-10 DIAGNOSIS — H52203 Unspecified astigmatism, bilateral: Secondary | ICD-10-CM | POA: Diagnosis not present

## 2022-07-09 DIAGNOSIS — E669 Obesity, unspecified: Secondary | ICD-10-CM | POA: Diagnosis not present

## 2022-07-09 DIAGNOSIS — Z6835 Body mass index (BMI) 35.0-35.9, adult: Secondary | ICD-10-CM | POA: Diagnosis not present

## 2022-07-09 DIAGNOSIS — E7800 Pure hypercholesterolemia, unspecified: Secondary | ICD-10-CM | POA: Diagnosis not present

## 2022-07-09 DIAGNOSIS — I10 Essential (primary) hypertension: Secondary | ICD-10-CM | POA: Diagnosis not present

## 2022-07-09 DIAGNOSIS — R7303 Prediabetes: Secondary | ICD-10-CM | POA: Diagnosis not present

## 2022-08-05 DIAGNOSIS — M25511 Pain in right shoulder: Secondary | ICD-10-CM | POA: Diagnosis not present

## 2022-08-13 DIAGNOSIS — E7800 Pure hypercholesterolemia, unspecified: Secondary | ICD-10-CM | POA: Diagnosis not present

## 2022-08-13 DIAGNOSIS — Z6835 Body mass index (BMI) 35.0-35.9, adult: Secondary | ICD-10-CM | POA: Diagnosis not present

## 2022-08-13 DIAGNOSIS — R7303 Prediabetes: Secondary | ICD-10-CM | POA: Diagnosis not present

## 2022-08-13 DIAGNOSIS — E669 Obesity, unspecified: Secondary | ICD-10-CM | POA: Diagnosis not present

## 2022-08-13 DIAGNOSIS — I10 Essential (primary) hypertension: Secondary | ICD-10-CM | POA: Diagnosis not present

## 2022-08-14 DIAGNOSIS — Z0000 Encounter for general adult medical examination without abnormal findings: Secondary | ICD-10-CM | POA: Diagnosis not present

## 2022-08-14 DIAGNOSIS — Z125 Encounter for screening for malignant neoplasm of prostate: Secondary | ICD-10-CM | POA: Diagnosis not present

## 2022-08-14 DIAGNOSIS — I10 Essential (primary) hypertension: Secondary | ICD-10-CM | POA: Diagnosis not present

## 2022-08-14 DIAGNOSIS — E7800 Pure hypercholesterolemia, unspecified: Secondary | ICD-10-CM | POA: Diagnosis not present

## 2022-08-21 DIAGNOSIS — M25511 Pain in right shoulder: Secondary | ICD-10-CM | POA: Diagnosis not present

## 2022-08-26 DIAGNOSIS — M25511 Pain in right shoulder: Secondary | ICD-10-CM | POA: Diagnosis not present

## 2022-09-17 DIAGNOSIS — M25511 Pain in right shoulder: Secondary | ICD-10-CM | POA: Diagnosis not present

## 2022-09-23 DIAGNOSIS — Z6835 Body mass index (BMI) 35.0-35.9, adult: Secondary | ICD-10-CM | POA: Diagnosis not present

## 2022-09-23 DIAGNOSIS — I10 Essential (primary) hypertension: Secondary | ICD-10-CM | POA: Diagnosis not present

## 2022-09-23 DIAGNOSIS — E7800 Pure hypercholesterolemia, unspecified: Secondary | ICD-10-CM | POA: Diagnosis not present

## 2022-09-23 DIAGNOSIS — R7303 Prediabetes: Secondary | ICD-10-CM | POA: Diagnosis not present

## 2022-09-23 DIAGNOSIS — E669 Obesity, unspecified: Secondary | ICD-10-CM | POA: Diagnosis not present

## 2022-12-30 DIAGNOSIS — I10 Essential (primary) hypertension: Secondary | ICD-10-CM | POA: Diagnosis not present

## 2022-12-30 DIAGNOSIS — Z6835 Body mass index (BMI) 35.0-35.9, adult: Secondary | ICD-10-CM | POA: Diagnosis not present

## 2022-12-30 DIAGNOSIS — R7303 Prediabetes: Secondary | ICD-10-CM | POA: Diagnosis not present

## 2022-12-30 DIAGNOSIS — E7800 Pure hypercholesterolemia, unspecified: Secondary | ICD-10-CM | POA: Diagnosis not present

## 2022-12-30 DIAGNOSIS — E669 Obesity, unspecified: Secondary | ICD-10-CM | POA: Diagnosis not present

## 2023-05-15 DIAGNOSIS — I10 Essential (primary) hypertension: Secondary | ICD-10-CM | POA: Diagnosis not present

## 2023-05-15 DIAGNOSIS — G4733 Obstructive sleep apnea (adult) (pediatric): Secondary | ICD-10-CM | POA: Diagnosis not present

## 2023-06-09 DIAGNOSIS — G4733 Obstructive sleep apnea (adult) (pediatric): Secondary | ICD-10-CM | POA: Diagnosis not present

## 2023-10-21 DIAGNOSIS — Z125 Encounter for screening for malignant neoplasm of prostate: Secondary | ICD-10-CM | POA: Diagnosis not present

## 2023-10-21 DIAGNOSIS — Z6837 Body mass index (BMI) 37.0-37.9, adult: Secondary | ICD-10-CM | POA: Diagnosis not present

## 2023-10-21 DIAGNOSIS — D229 Melanocytic nevi, unspecified: Secondary | ICD-10-CM | POA: Diagnosis not present

## 2023-10-21 DIAGNOSIS — Z131 Encounter for screening for diabetes mellitus: Secondary | ICD-10-CM | POA: Diagnosis not present

## 2023-10-21 DIAGNOSIS — E7800 Pure hypercholesterolemia, unspecified: Secondary | ICD-10-CM | POA: Diagnosis not present

## 2023-10-21 DIAGNOSIS — Z0000 Encounter for general adult medical examination without abnormal findings: Secondary | ICD-10-CM | POA: Diagnosis not present

## 2023-10-21 DIAGNOSIS — Z23 Encounter for immunization: Secondary | ICD-10-CM | POA: Diagnosis not present

## 2023-10-21 DIAGNOSIS — E669 Obesity, unspecified: Secondary | ICD-10-CM | POA: Diagnosis not present

## 2023-10-21 DIAGNOSIS — I10 Essential (primary) hypertension: Secondary | ICD-10-CM | POA: Diagnosis not present

## 2023-11-05 DIAGNOSIS — G4733 Obstructive sleep apnea (adult) (pediatric): Secondary | ICD-10-CM | POA: Diagnosis not present

## 2023-11-05 DIAGNOSIS — I10 Essential (primary) hypertension: Secondary | ICD-10-CM | POA: Diagnosis not present

## 2024-02-08 DIAGNOSIS — M542 Cervicalgia: Secondary | ICD-10-CM | POA: Diagnosis not present

## 2024-04-20 DIAGNOSIS — E7800 Pure hypercholesterolemia, unspecified: Secondary | ICD-10-CM | POA: Diagnosis not present

## 2024-04-20 DIAGNOSIS — I10 Essential (primary) hypertension: Secondary | ICD-10-CM | POA: Diagnosis not present

## 2024-04-20 DIAGNOSIS — E669 Obesity, unspecified: Secondary | ICD-10-CM | POA: Diagnosis not present

## 2024-04-20 DIAGNOSIS — Z6837 Body mass index (BMI) 37.0-37.9, adult: Secondary | ICD-10-CM | POA: Diagnosis not present

## 2024-04-20 DIAGNOSIS — R7303 Prediabetes: Secondary | ICD-10-CM | POA: Diagnosis not present

## 2024-05-03 DIAGNOSIS — G4733 Obstructive sleep apnea (adult) (pediatric): Secondary | ICD-10-CM | POA: Diagnosis not present

## 2024-05-03 DIAGNOSIS — I10 Essential (primary) hypertension: Secondary | ICD-10-CM | POA: Diagnosis not present

## 2024-06-14 DIAGNOSIS — G4733 Obstructive sleep apnea (adult) (pediatric): Secondary | ICD-10-CM | POA: Diagnosis not present

## 2024-06-14 DIAGNOSIS — I10 Essential (primary) hypertension: Secondary | ICD-10-CM | POA: Diagnosis not present

## 2024-06-22 DIAGNOSIS — R053 Chronic cough: Secondary | ICD-10-CM | POA: Diagnosis not present

## 2024-07-09 DIAGNOSIS — G519 Disorder of facial nerve, unspecified: Secondary | ICD-10-CM | POA: Diagnosis not present

## 2024-07-25 ENCOUNTER — Ambulatory Visit

## 2024-07-25 VITALS — BP 138/80 | HR 75 | Temp 98.6°F | Ht 67.0 in | Wt 244.8 lb

## 2024-07-25 DIAGNOSIS — I10 Essential (primary) hypertension: Secondary | ICD-10-CM | POA: Diagnosis not present

## 2024-07-25 DIAGNOSIS — R052 Subacute cough: Secondary | ICD-10-CM | POA: Diagnosis not present

## 2024-07-25 NOTE — Patient Instructions (Signed)
  VISIT SUMMARY: During your visit, we discussed your persistent cough following a COVID-19 infection and your hypertension management. We reviewed your symptoms, medical history, and family history to determine the best course of action.  YOUR PLAN: -POSTINFECTIOUS COUGH WITH SUSPECTED LISINOPRIL-INDUCED COMPONENT: Your persistent dry cough after COVID-19 may be worsened by your current blood pressure medication, lisinopril. We recommend switching your medication to an ARB like valsartan or losartan, which may help reduce the cough. Additionally, we recommend following a diet to manage potential silent acid reflux, and you can continue using over-the-counter cough remedies like lozenges. If the cough persists. We will discuss further options if there is no improvement in a couple of weeks.  -ESSENTIAL HYPERTENSION: Your high blood pressure has been managed with lisinopril, but since it may be contributing to your cough, we will switch you to an ARB such as valsartan or losartan. This change should help manage your blood pressure without causing a cough.  INSTRUCTIONS: Please switch from lisinopril to an ARB like valsartan or losartan as discussed. Follow a GERD diet for the next couple of weeks and continue using over-the-counter cough remedies like lozenges. If your cough does not improve, we may consider a short course of prednisone or guaifenesin. We will discuss further options if there is no improvement in a couple of weeks.   Foods to Avoid:  Acidic foods: Citrus fruits, tomatoes, tomato-based products, vinegar, garlic, onions  Fatty foods: Fried foods, fatty meats, whole milk, cheese  Spicy foods: Chili peppers, black pepper, mustard  Chocolate: Contains caffeine and theobromine, which relax the lower esophageal sphincter (LES)  Alcohol: Relaxes the LES and increases stomach acid production  Caffeine: Found in coffee, tea, and some sodas, it can stimulate stomach acid production    Foods  to Eat:  Lean proteins: Chicken, fish, eggs Non-acidic fruits: Apples, bananas, pears, grapes Vegetables: Steamed, roasted, or boiled vegetables (e.g., broccoli, carrots, green beans) Whole grains: Brown rice, quinoa, oatmeal Low-fat dairy: Skim milk, low-fat yogurt, low-fat cheese Water: Staying hydrated helps dilute stomach acid    Other Dietary Recommendations: Eat smaller, more frequent meals. Avoid eating within 2-3 hours of bedtime. Chew food thoroughly. Limit sugary drinks and processed foods. Consider a Mediterranean-style diet, which is rich in fruits, vegetables, and whole grains.

## 2024-07-25 NOTE — Progress Notes (Signed)
 Subjective:   PATIENT ID: Jesse Jenkins GENDER: male DOB: May 24, 1971, MRN: [id number]   HPI Discussed the use of AI scribe software for clinical note transcription with the patient, who gave verbal consent to proceed.  History of Present Illness Jesse Jenkins is a 53 year old male who presents with a persistent cough following a COVID-19 infection.  He had COVID-19 in August, and while most symptoms resolved, he has experienced a persistent dry, nonproductive cough since then. Initially, the cough was severe, making conversations difficult and triggered by laughing. By October, he sought medical attention as the cough persisted. A chest x-ray was performed, which temporarily worsened the cough due to the technique, but it has since calmed down.  Currently, he experiences an occasional cough that does not significantly interfere with daily activities. The cough frequency has decreased from 40-50 times an hour to once every day or two. He has not noticed any impact on the cough from exercise, and he has not experienced wheezing.  He has been taking lisinopril for hypertension for years without prior issues. He took prednisone in early November for a dental issue, which did not affect the cough.  His father had pulmonary fibrosis and passed away at the age of 12, two to three years after diagnosis. His father was a former smoker and a bus driver, which may have exposed him to diesel fumes.  No symptoms of acid reflux, heartburn, or postnasal drip. He has not noticed any correlation between the cough and the time of day, position, or food intake.     Past Medical History:  Diagnosis Date   Borderline diabetes    Hyperlipidemia    Hypertension      History reviewed. No pertinent family history.   Social History   Socioeconomic History   Marital status: Married    Spouse name: Not on file   Number of children: Not on file   Years of education: Not on file   Highest education  level: Not on file  Occupational History   Not on file  Tobacco Use   Smoking status: Never   Smokeless tobacco: Never  Substance and Sexual Activity   Alcohol use: Yes    Comment: 1 drink a week   Drug use: Never   Sexual activity: Yes  Other Topics Concern   Not on file  Social History Narrative   Not on file   Social Drivers of Health   Financial Resource Strain: Not on file  Food Insecurity: Not on file  Transportation Needs: Not on file  Physical Activity: Not on file  Stress: Not on file  Social Connections: Not on file  Intimate Partner Violence: Not on file     No Known Allergies   Outpatient Medications Prior to Visit  Medication Sig Dispense Refill   atorvastatin (LIPITOR) 80 MG tablet Take 40 mg by mouth daily.     Cholecalciferol (VITAMIN D3) 50 MCG (2000 UT) capsule Take 2,000 Units by mouth daily.     hydrochlorothiazide (HYDRODIURIL) 25 MG tablet Take 25 mg by mouth daily.     lisinopril (ZESTRIL) 20 MG tablet Take 20 mg by mouth daily.     loratadine (CLARITIN) 10 MG tablet Take 10 mg by mouth daily as needed for allergies.     Omega-3 Fatty Acids (FISH OIL) 1000 MG CAPS 1,000 mg.     sertraline (ZOLOFT) 25 MG tablet Take 25 mg by mouth daily.     gentamicin  cream (GARAMYCIN )  0.1 % Apply 1 application topically 2 (two) times daily. 15 g 1   No facility-administered medications prior to visit.    ROS Reviewed all systems and reported negative except as above     Objective:   Vitals:   07/25/24 0822  BP: 138/80  Pulse: 75  Temp: 98.6 F (37 C)  SpO2: 99%  Weight: 244 lb 12.8 oz (111 kg)  Height: 5' 7 (1.702 m)    Physical Exam Physical Exam GENERAL: Appropriate to age, no acute distress. HEAD EYES EARS NOSE THROAT: Moist mucous membranes, atraumatic, normocephalic. CHEST: Clear to auscultation bilaterally, no wheezing, no crackles, no rales, lungs normal. CARDIAC: Regular rate and rhythm, normal S1, normal S2, no murmurs, no rubs, no  gallops. ABDOMEN: Soft, nontender. NEUROLOGICAL: Motor and sensation grossly intact, alert and oriented times X 3. EXTREMITIES: Warm, well perfused, no edema.     CBC No results found for: WBC, RBC, HGB, HCT, PLT, MCV, MCH, MCHC, RDW, LYMPHSABS, MONOABS, EOSABS, BASOSABS   Chest imaging: Reviewed CXR performed this year showing no visible infiltrates         Assessment & Plan:   Assessment and Plan Assessment & Plan Postinfectious cough with suspected lisinopril-induced component Persistent dry cough post-COVID-19, possibly exacerbated by lisinopril. Normal chest x-ray, no GERD symptoms, no wheezing. Differential includes silent acid reflux. - Switch lisinopril to an ARB such as valsartan or losartan. - Implement GERD diet for the next couple of weeks. - Continue using over-the-counter cough remedies like lozenges. - Consider short course of prednisone or opiate containing cough supressant if cough persists. - Discuss further options if no improvement in a couple of weeks.  Essential hypertension Managed with lisinopril, suspected to contribute to cough. Alternative antihypertensives discussed. - Switch lisinopril to an ARB such as valsartan or losartan.  Family history of IPF Father had IPF at the age of 1 and passed away from it at the age of 5. Was a smoker for a while but then quit. Patient's hair became gray in his mid thirties.         Zola Herter, MD [facility] Office: [phone]

## 2024-08-08 DIAGNOSIS — R002 Palpitations: Secondary | ICD-10-CM | POA: Diagnosis not present

## 2024-08-08 DIAGNOSIS — R5383 Other fatigue: Secondary | ICD-10-CM | POA: Diagnosis not present

## 2024-08-23 DIAGNOSIS — I491 Atrial premature depolarization: Secondary | ICD-10-CM | POA: Diagnosis not present
